# Patient Record
Sex: Female | Born: 2000 | Race: Black or African American | Hispanic: No | Marital: Single | Smoking: Never smoker
Health system: Southern US, Community
[De-identification: ages and names within clinical notes are randomized; demographics above are authoritative.]

## PROBLEM LIST (undated history)

## (undated) DIAGNOSIS — F909 Attention-deficit hyperactivity disorder, unspecified type: Secondary | ICD-10-CM

---
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
## 2019-07-01 ENCOUNTER — Encounter (HOSPITAL_COMMUNITY): Payer: Self-pay | Admitting: Emergency Medicine

## 2019-07-01 ENCOUNTER — Emergency Department (HOSPITAL_COMMUNITY): Payer: Medicaid Other

## 2019-07-01 ENCOUNTER — Other Ambulatory Visit: Payer: Self-pay

## 2019-07-01 ENCOUNTER — Emergency Department (HOSPITAL_COMMUNITY)
Admission: EM | Admit: 2019-07-01 | Discharge: 2019-07-01 | Disposition: A | Discharge status: Home / Self Care | Payer: Medicaid Other | Attending: Emergency Medicine | Admitting: Emergency Medicine

## 2019-07-01 DIAGNOSIS — S46912A Strain of unspecified muscle, fascia and tendon at shoulder and upper arm level, left arm, initial encounter: Secondary | ICD-10-CM | POA: Insufficient documentation

## 2019-07-01 DIAGNOSIS — S4992XA Unspecified injury of left shoulder and upper arm, initial encounter: Secondary | ICD-10-CM | POA: Diagnosis present

## 2019-07-01 DIAGNOSIS — Y9389 Activity, other specified: Secondary | ICD-10-CM | POA: Insufficient documentation

## 2019-07-01 DIAGNOSIS — Y929 Unspecified place or not applicable: Secondary | ICD-10-CM | POA: Diagnosis not present

## 2019-07-01 DIAGNOSIS — X509XXA Other and unspecified overexertion or strenuous movements or postures, initial encounter: Secondary | ICD-10-CM | POA: Insufficient documentation

## 2019-07-01 DIAGNOSIS — Y998 Other external cause status: Secondary | ICD-10-CM | POA: Insufficient documentation
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
## 2019-09-27 ENCOUNTER — Emergency Department (HOSPITAL_COMMUNITY)
Admission: EM | Admit: 2019-09-27 | Discharge: 2019-09-27 | Discharge status: Against Medical Advice | Payer: Medicaid Other

## 2020-05-20 ENCOUNTER — Other Ambulatory Visit: Payer: Self-pay

## 2020-05-20 ENCOUNTER — Ambulatory Visit (HOSPITAL_COMMUNITY)
Admission: EM | Admit: 2020-05-20 | Discharge: 2020-05-20 | Disposition: A | Discharge status: Other Acute Inpatient Hospital | Payer: Medicaid Other | Attending: Registered Nurse | Admitting: Registered Nurse

## 2020-05-20 ENCOUNTER — Encounter (HOSPITAL_COMMUNITY): Payer: Self-pay | Admitting: Registered Nurse

## 2020-05-20 ENCOUNTER — Ambulatory Visit (HOSPITAL_COMMUNITY)
Admission: RE | Admit: 2020-05-20 | Discharge: 2020-05-20 | Disposition: A | Discharge status: Home / Self Care | Payer: Medicaid Other | Source: Home / Self Care | Attending: Psychiatry | Admitting: Psychiatry

## 2020-05-20 ENCOUNTER — Inpatient Hospital Stay (HOSPITAL_COMMUNITY)
Admission: AD | Admit: 2020-05-20 | Discharge: 2020-05-23 | DRG: 885 | Disposition: A | Discharge status: Home / Self Care | Payer: Medicaid Other | Source: Intra-hospital | Attending: Psychiatry | Admitting: Psychiatry

## 2020-05-20 DIAGNOSIS — G4700 Insomnia, unspecified: Secondary | ICD-10-CM | POA: Diagnosis present

## 2020-05-20 DIAGNOSIS — Z20822 Contact with and (suspected) exposure to covid-19: Secondary | ICD-10-CM | POA: Diagnosis present

## 2020-05-20 DIAGNOSIS — R45851 Suicidal ideations: Secondary | ICD-10-CM | POA: Diagnosis present

## 2020-05-20 DIAGNOSIS — F332 Major depressive disorder, recurrent severe without psychotic features: Secondary | ICD-10-CM | POA: Insufficient documentation

## 2020-05-20 LAB — URINALYSIS, ROUTINE W REFLEX MICROSCOPIC
Bilirubin Urine: NEGATIVE
Glucose, UA: NEGATIVE mg/dL
Hgb urine dipstick: NEGATIVE
Ketones, ur: NEGATIVE mg/dL
Leukocytes,Ua: NEGATIVE
Nitrite: NEGATIVE
Protein, ur: NEGATIVE mg/dL
Specific Gravity, Urine: 1.013 (ref 1.005–1.030)
pH: 7 (ref 5.0–8.0)

## 2020-05-20 LAB — CBC WITH DIFFERENTIAL/PLATELET
Abs Immature Granulocytes: 0.01 10*3/uL (ref 0.00–0.07)
Basophils Absolute: 0 10*3/uL (ref 0.0–0.1)
Basophils Relative: 1 %
Eosinophils Absolute: 0.1 10*3/uL (ref 0.0–0.5)
Eosinophils Relative: 1 %
HCT: 42.1 % (ref 36.0–46.0)
Hemoglobin: 13.4 g/dL (ref 12.0–15.0)
Immature Granulocytes: 0 %
Lymphocytes Relative: 34 %
Lymphs Abs: 2 10*3/uL (ref 0.7–4.0)
MCH: 26.7 pg (ref 26.0–34.0)
MCHC: 31.8 g/dL (ref 30.0–36.0)
MCV: 83.9 fL (ref 80.0–100.0)
Monocytes Absolute: 0.4 10*3/uL (ref 0.1–1.0)
Monocytes Relative: 6 %
Neutro Abs: 3.4 10*3/uL (ref 1.7–7.7)
Neutrophils Relative %: 58 %
Platelets: 282 10*3/uL (ref 150–400)
RBC: 5.02 MIL/uL (ref 3.87–5.11)
RDW: 14.5 % (ref 11.5–15.5)
WBC: 5.8 10*3/uL (ref 4.0–10.5)
nRBC: 0 % (ref 0.0–0.2)

## 2020-05-20 LAB — COMPREHENSIVE METABOLIC PANEL
ALT: 18 U/L (ref 0–44)
AST: 23 U/L (ref 15–41)
Albumin: 4.6 g/dL (ref 3.5–5.0)
Alkaline Phosphatase: 61 U/L (ref 38–126)
Anion gap: 9 (ref 5–15)
BUN: 11 mg/dL (ref 6–20)
CO2: 26 mmol/L (ref 22–32)
Calcium: 9.9 mg/dL (ref 8.9–10.3)
Chloride: 104 mmol/L (ref 98–111)
Creatinine, Ser: 0.97 mg/dL (ref 0.44–1.00)
GFR, Estimated: >60 mL/min (ref >60)
Glucose, Bld: 89 mg/dL (ref 70–99)
Potassium: 4.4 mmol/L (ref 3.5–5.1)
Sodium: 139 mmol/L (ref 135–145)
Total Bilirubin: 0.4 mg/dL (ref 0.3–1.2)
Total Protein: 8.1 g/dL (ref 6.5–8.1)

## 2020-05-20 LAB — POCT URINE DRUG SCREEN - MANUAL ENTRY (I-SCREEN)
POC Amphetamine UR: NOT DETECTED
POC Buprenorphine (BUP): NOT DETECTED
POC Cocaine UR: NOT DETECTED
POC Marijuana UR: NOT DETECTED
POC Methadone UR: NOT DETECTED
POC Methamphetamine UR: NOT DETECTED
POC Morphine: NOT DETECTED
POC Oxazepam (BZO): NOT DETECTED
POC Oxycodone UR: NOT DETECTED
POC Secobarbital (BAR): NOT DETECTED

## 2020-05-20 LAB — TSH: TSH: 0.865 u[IU]/mL (ref 0.350–4.500)

## 2020-05-20 LAB — POC SARS CORONAVIRUS 2 AG -  ED: SARS Coronavirus 2 Ag: NEGATIVE

## 2020-05-20 LAB — LIPID PANEL
Cholesterol: 173 mg/dL (ref 0–200)
HDL: 57 mg/dL (ref >40)
LDL Cholesterol: 109 mg/dL — ABNORMAL HIGH (ref 0–99)
Total CHOL/HDL Ratio: 3 RATIO
Triglycerides: 35 mg/dL (ref <150)
VLDL: 7 mg/dL (ref 0–40)

## 2020-05-20 LAB — RESP PANEL BY RT-PCR (FLU A&B, COVID) ARPGX2
Influenza A by PCR: NEGATIVE
Influenza B by PCR: NEGATIVE
SARS Coronavirus 2 by RT PCR: NEGATIVE

## 2020-05-20 LAB — HEMOGLOBIN A1C
Hgb A1c MFr Bld: 5.3 % (ref 4.8–5.6)
Mean Plasma Glucose: 105.41 mg/dL

## 2020-05-20 LAB — PREGNANCY, URINE: Preg Test, Ur: NEGATIVE

## 2020-05-20 LAB — POC SARS CORONAVIRUS 2 AG: SARS Coronavirus 2 Ag: NEGATIVE

## 2020-05-20 LAB — MAGNESIUM: Magnesium: 2.3 mg/dL (ref 1.7–2.4)

## 2020-05-20 LAB — ETHANOL: Alcohol, Ethyl (B): <10 mg/dL (ref <10)

## 2020-05-20 MED ORDER — ACETAMINOPHEN 325 MG PO TABS: 650.0000 mg | ORAL_TABLET | Freq: Four times a day (QID) | ORAL | Status: DC | PRN

## 2020-05-20 NOTE — Progress Notes (Signed)
Pt is admitted to Flex bed 2 due to SI with no plan. Pt is alert and oriented with flat affect. Pt is ambulatory and is oriented to staff and unit. Cooperative with skin assessment. Nourishment offered. Pt denies pain, SI, HI and AVH at this time. Staff will monitor for pt's safety.

## 2020-05-20 NOTE — ED Notes (Signed)
Pt alert and orient x 4. Pt calm and cooperative. No c/o of pain or distress. Will continue to monitor for safety

## 2020-05-20 NOTE — ED Notes (Signed)
Safe transport called 

## 2020-05-20 NOTE — BH Assessment (Addendum)
Upon chart review: TTS LCSW, "Gave clinical report to Vernard Gambles , NP who determined Pt meets criteria for inpatient psychiatric treatment. Rona Ravens  [facility] at [facility] confirmed an appropriate bed is not currently available. Patient will transfer to [facility] per Reola Calkins, NP  Other facilities will be contacted for placement. Notified Dr. Ovidio Kin  , MD and Reola Calkins, NP of disposition recommendation and the sitter utilization recommendation."   Followed up with [facility] Lorin Glass, RN) @1754  to inquire about bed availability for this patient via secure chat.  Requested [facility] to review for a Mood Disorder bed.
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
## 2020-05-20 NOTE — BH Assessment (Signed)
Disposition: Patient accepted to [facility] adult unit, pending a negative COVID PCR. The accepting provider is Vernard Gambles. The attending provider is Dr. Jola Babinski. Patient assigned to Room #303-1. Nurse to nurse report [phone]. [facility] nursing to arrange transport to [facility].  Ryta, RN and Tobey Bride, RN, provided disposition updates.

## 2020-05-20 NOTE — ED Notes (Signed)
On the phone 
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
## 2020-05-20 NOTE — ED Notes (Signed)
Report called to [place] RN@[facility]  adult
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
## 2020-05-21 ENCOUNTER — Encounter (HOSPITAL_COMMUNITY): Payer: Self-pay | Admitting: Psychiatry

## 2020-05-21 DIAGNOSIS — F332 Major depressive disorder, recurrent severe without psychotic features: Secondary | ICD-10-CM | POA: Insufficient documentation

## 2020-05-21 LAB — POCT PREGNANCY, URINE: Preg Test, Ur: NEGATIVE

## 2020-05-21 MED ORDER — TRAZODONE HCL 50 MG PO TABS: 50.0000 mg | ORAL_TABLET | Freq: Every evening | ORAL | Status: DC | PRN

## 2020-05-21 MED ORDER — MAGNESIUM HYDROXIDE 400 MG/5ML PO SUSP: 30.0000 mL | Freq: Every day | ORAL | Status: DC | PRN

## 2020-05-21 MED ORDER — HYDROXYZINE HCL 25 MG PO TABS: 25.0000 mg | ORAL_TABLET | Freq: Three times a day (TID) | ORAL | Status: DC | PRN

## 2020-05-21 MED ORDER — ALUM & MAG HYDROXIDE-SIMETH 200-200-20 MG/5ML PO SUSP: 30.0000 mL | ORAL | Status: DC | PRN

## 2020-05-21 MED ORDER — ACETAMINOPHEN 325 MG PO TABS: 650.0000 mg | ORAL_TABLET | Freq: Four times a day (QID) | ORAL | Status: DC | PRN

## 2020-05-21 MED ORDER — SERTRALINE HCL 50 MG PO TABS
50.0000 mg | ORAL_TABLET | Freq: Every day | ORAL | Status: DC
  Administered 2020-05-21 – 2020-05-23 (×3): 50 mg via ORAL
  Filled 2020-05-21 (×5): qty 1

## 2020-05-21 NOTE — Progress Notes (Cosign Needed)
Adult Psychoeducational Group Note  Date:  05/21/2020 Time:  10:26 AM  Group Topic/Focus:  Goals Group:   The focus of this group is to help patients establish daily goals to achieve during treatment and discuss how the patient can incorporate goal setting into their daily lives to aide in recovery.  Participation Level:  Minimal  Participation Quality:  Appropriate  Affect:  Appropriate  Cognitive:  Lacking  Insight: Lacking  Engagement in Group:  Limited  Modes of Intervention:  Discussion  Additional Comments:  Pt attended group but did minimal participation.   Kyndall Chaplin R Artha Chiasson 05/21/2020, 10:26 AM

## 2020-05-21 NOTE — Plan of Care (Signed)
Nurse discussed coping skills with patient.  
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
## 2020-05-21 NOTE — Tx Team (Signed)
Initial Treatment Plan 05/21/2020 3:15 AM [place] Poli DBZ:[id number]    PATIENT STRESSORS: Educational concerns Marital or family conflict   PATIENT STRENGTHS: Ability for insight Average or above average intelligence Capable of independent living General fund of knowledge Motivation for treatment/growth Supportive family/friends   PATIENT IDENTIFIED PROBLEMS: Suicidal ideation   depression  (overall mood and coping mechanisms)                 DISCHARGE CRITERIA:  Improved stabilization in mood, thinking, and/or behavior Motivation to continue treatment in a less acute level of care Verbal commitment to aftercare and medication compliance  PRELIMINARY DISCHARGE PLAN: Outpatient therapy Return to previous living arrangement Return to previous work or school arrangements  PATIENT/FAMILY INVOLVEMENT: This treatment plan has been presented to and reviewed with the patient, Sarah Alexander, and/or family member.  The patient and family have been given the opportunity to ask questions and make suggestions.  Mancel Bale, RN 05/21/2020, 3:15 AM

## 2020-05-21 NOTE — Progress Notes (Signed)
   05/21/20 2030  Psych Admission Type (Psych Patients Only)  Admission Status Voluntary  Psychosocial Assessment  Patient Complaints Anxiety;Depression  Eye Contact Fair  Facial Expression Anxious  Affect Flat;Depressed;Sad  Speech Logical/coherent  Interaction Assertive  Motor Activity Other (Comment) (wnl)  Appearance/Hygiene Unremarkable  Behavior Characteristics Cooperative;Anxious  Mood Depressed;Anxious  Thought Process  Coherency WDL  Content Blaming self  Delusions None reported or observed  Perception WDL  Hallucination None reported or observed  Judgment Poor  Confusion None  Danger to Self  Current suicidal ideation? Denies  Danger to Others  Danger to Others None reported or observed   Pt seen in dayroom. Pt denies SI, HI, AVH and pain. Pt rates anxiety 2-3/10 and depression 2-3/10. This is patient's first inpatient admission. "I was a bit anxious this morning. I came in last night and went right to sleep. This morning I was thinking, 'I have become one of those people.'" Asked pt if she still felt that way and she said, "I realized that there are lots of different people here. I stopped judging." Pt says she was open to medication even though she previously thought it was not for her. "What I was doing wasn't working so I'm thinking about medication." Pt anxious about getting in touch with her advisor about scheduling her classes. "I'm supposed to take classes in summer school and the fall and I need to make sure I get in them but it's hard to manage while I'm in here."

## 2020-05-21 NOTE — Progress Notes (Signed)
D:  Patient's self inventory sheet, patient has poor sleep, sleep medication given.  Fair appetite, low energy level, poor concentration.  Rated depresson 8, hopeless 9.anxiety 6.  Denied withdrawals.  Denied SI.  Denied physical pain. Goal is get ADHD medication, stop crying, and being depressed daily. No discharge plan. A.  Medications administered per MD offers.  Safety maintained with 15 minute checks. R:  Safety maintained with 15 minute checks.  Denied SI and HI, contracts for safety.  Denied A/V hallucinations.

## 2020-05-21 NOTE — Progress Notes (Signed)
Admission Note: 05/20/2020 Sarah Alexander MRN: [id number] Patient is 20 year old female who presented to [facility] from [facility] for suicidal ideation with no plan and worsening depression. Patient reports a break up with her boyfriend, social issues with friends, and educational issues are her main stressors. Patient reports feeling depressed over the past year but reports it has worsened within the past month. Patient presents with flat and sad affect at time of assessment. Patient is positive for passive SI and verbally contracts for safety. Patient denies HI/AVH at this time.
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
## 2020-05-22 NOTE — Progress Notes (Signed)
RN reviewed Georga Kaufmann (Runner, broadcasting/film/video) documentation of pt's flowsheets for today and assessment and this RN is in agreement with documentation and pt's plan of care.

## 2020-05-22 NOTE — Progress Notes (Cosign Needed)
Adult Psychoeducational Group Note  Date:  05/22/2020 Time:  11:59 AM  Group Topic/Focus:  Goals Group:   The focus of this group is to help patients establish daily goals to achieve during treatment and discuss how the patient can incorporate goal setting into their daily lives to aide in recovery.  Participation Level:  Active  Participation Quality:  Appropriate  Affect:  Appropriate  Cognitive:  Alert  Insight: Appropriate  Engagement in Group:  Engaged  Modes of Intervention:  Discussion  Additional Comments:  Pt attended group and participated in discussion.  Sarah Alexander 05/22/2020, 11:59 AM

## 2020-05-22 NOTE — Progress Notes (Signed)
[facility] MD Progress Note  05/22/2020 3:05 PM Sarah Alexander  MRN:  [id number]   Subjective:  "I am feeling way better today."  Objective: Medical record reviewed.  The patient's case was discussed in detail with members of the treatment team.  I met with and interviewed the patient today in the office on the unit.  The patient reports that she feels much less depressed and less anxious today than she did yesterday.  "I am feeling way better today."  Patient reports feeling less down on herself about having been admitted to the hospital.  She has found conversations with peers to be helpful and supportive.  The patient reports experiencing less academic stress since she was able to make a call with the assistance of the social worker to her school this morning.  She states that her depression is now a 1 out of 10 in intensity.  She is much less anxious.  The patient denies passive wish for death and denies any suicidal ideation, intent or plan.  She denies thoughts of engaging in nonsuicidal self-injurious behavior, AI, HI, PI, AH or VH.  The patient reports that appetite is good.  She is sleeping adequately.  The patient denies any side effects from her medications.  She denies any physical problems.  During my interview with patient her eye contact is much improved.  There is good relatedness.  She is engaged in our conversation.  There are no motor abnormalities.  Speech is spontaneous and of normal rate and amount.  Mood is better.  Affect appears euthymic with appropriate range and reactivity.  Thought processes are coherent, linear and goal-directed.  There is no evidence of formal thought disorder.  Thought content contains no paranoid ideation, no referential thinking and no delusional content.  There is less ruminative worry.  The patient denies any perceptual abnormalities and does not appear to respond to internal stimuli.  She is alert and oriented.  Attention and concentration are grossly intact.   Insight and judgment are intact.  I discussed with patient that the team is working on setting up outpatient psychotherapy and psychiatric medication management appointments for her.  Patient expressed that she is interested in participating in outpatient treatment.  She now feels less overwhelmed by academic, relationship and financial stressors.    Per staff report and chart review, the patient has been interacting appropriately with staff and peers.  She has been attending groups.  The patient has not engaged in any self-injurious behavior or any aggressive behavior while on the unit.  She is taking standing dose medications as prescribed.  Patient did not take any as needed medications yesterday.  Vital signs this morning included blood pressure of 123/100 seated and 122/99 standing.  Heart rate was 92 seated and 117 standing.    Principal Problem: MDD (major depressive disorder), recurrent severe, without psychosis ([place]) Diagnosis: Principal Problem:   MDD (major depressive disorder), recurrent severe, without psychosis ([place])  Total Time spent with patient: 20 minutes  Past Psychiatric History: See H&P  Past Medical History:  Past Medical History:  Diagnosis Date  . ADHD    History reviewed. No pertinent surgical history. Family History: History reviewed. No pertinent family history. Family Psychiatric  History: See H&P Social History:  Social History   Substance and Sexual Activity  Alcohol Use None     Social History   Substance and Sexual Activity  Drug Use Not on file    Social History   Socioeconomic History  .  Marital status: Single    Spouse name: Not on file  . Number of children: Not on file  . Years of education: Not on file  . Highest education level: Not on file  Occupational History  . Not on file  Tobacco Use  . Smoking status: Never Smoker  . Smokeless tobacco: Never Used  Vaping Use  . Vaping Use: Never used  Substance and Sexual Activity  . Alcohol  use: Not on file  . Drug use: Not on file  . Sexual activity: Not Currently  Other Topics Concern  . Not on file  Social History Narrative  . Not on file   Social Determinants of Health   Financial Resource Strain: Not on file  Food Insecurity: Not on file  Transportation Needs: Not on file  Physical Activity: Not on file  Stress: Not on file  Social Connections: Not on file   Additional Social History:                         Sleep: Good  Appetite:  Good  Current Medications: Current Facility-Administered Medications  Medication Dose Route Frequency Provider Last Rate Last Admin  . acetaminophen (TYLENOL) tablet 650 mg  650 mg Oral Q6H PRN Nwoko, Uchenna E, PA      . alum & mag hydroxide-simeth (MAALOX/MYLANTA) 200-200-20 MG/5ML suspension 30 mL  30 mL Oral Q4H PRN Nwoko, Uchenna E, PA      . hydrOXYzine (ATARAX/VISTARIL) tablet 25 mg  25 mg Oral TID PRN Nwoko, Uchenna E, PA      . magnesium hydroxide (MILK OF MAGNESIA) suspension 30 mL  30 mL Oral Daily PRN Nwoko, Uchenna E, PA      . sertraline (ZOLOFT) tablet 50 mg  50 mg Oral Daily Arthor Captain, MD   50 mg at 05/22/20 3016  . traZODone (DESYREL) tablet 50 mg  50 mg Oral QHS PRN Nwoko, Uchenna E, PA        Lab Results:  Results for orders placed or performed during the hospital encounter of 05/20/20 (from the past 48 hour(s))  Resp Panel by RT-PCR (Flu A&B, Covid) Nasopharyngeal Swab     Status: None   Collection Time: 05/20/20  5:07 PM   Specimen: Nasopharyngeal Swab; Nasopharyngeal(NP) swabs in vial transport medium  Result Value Ref Range   SARS Coronavirus 2 by RT PCR NEGATIVE NEGATIVE    Comment: (NOTE) SARS-CoV-2 target nucleic acids are NOT DETECTED.  The SARS-CoV-2 RNA is generally detectable in upper respiratory specimens during the acute phase of infection. The lowest concentration of SARS-CoV-2 viral copies this assay can detect is 138 copies/mL. A negative result does not preclude  SARS-Cov-2 infection and should not be used as the sole basis for treatment or other patient management decisions. A negative result may occur with  improper specimen collection/handling, submission of specimen other than nasopharyngeal swab, presence of viral mutation(s) within the areas targeted by this assay, and inadequate number of viral copies(<138 copies/mL). A negative result must be combined with clinical observations, patient history, and epidemiological information. The expected result is Negative.  Fact Sheet for Patients:  EntrepreneurPulse.com.au  Fact Sheet for Healthcare Providers:  IncredibleEmployment.be  This test is no t yet approved or cleared by the [place] FDA and  has been authorized for detection and/or diagnosis of SARS-CoV-2 by FDA under an Emergency Use Authorization (EUA). This EUA will remain  in effect (meaning this test can be used) for the  duration of the COVID-19 declaration under Section 564(b)(1) of the Act, 21 U.S.C.section 360bbb-3(b)(1), unless the authorization is terminated  or revoked sooner.       Influenza A by PCR NEGATIVE NEGATIVE   Influenza B by PCR NEGATIVE NEGATIVE    Comment: (NOTE) The Xpert Xpress SARS-CoV-2/FLU/RSV plus assay is intended as an aid in the diagnosis of influenza from Nasopharyngeal swab specimens and should not be used as a sole basis for treatment. Nasal washings and aspirates are unacceptable for Xpert Xpress SARS-CoV-2/FLU/RSV testing.  Fact Sheet for Patients: EntrepreneurPulse.com.au  Fact Sheet for Healthcare Providers: IncredibleEmployment.be  This test is not yet approved or cleared by the [place] FDA and has been authorized for detection and/or diagnosis of SARS-CoV-2 by FDA under an Emergency Use Authorization (EUA). This EUA will remain in effect (meaning this test can be used) for the duration of the COVID-19  declaration under Section 564(b)(1) of the Act, 21 U.S.C. section 360bbb-3(b)(1), unless the authorization is terminated or revoked.  Performed at [facility], [place] [address] [place], [place] [address]   POC SARS Coronavirus 2 Ag-ED - Nasal Swab (BD Veritor Kit)     Status: None   Collection Time: 05/20/20  5:07 PM  Result Value Ref Range   SARS Coronavirus 2 Ag Negative Negative  CBC with Differential/Platelet     Status: None   Collection Time: 05/20/20  5:14 PM  Result Value Ref Range   WBC 5.8 4.0 - 10.5 K/uL   RBC 5.02 3.87 - 5.11 MIL/uL   Hemoglobin 13.4 12.0 - 15.0 g/dL   HCT 42.1 36.0 - 46.0 %   MCV 83.9 80.0 - 100.0 fL   MCH 26.7 26.0 - 34.0 pg   MCHC 31.8 30.0 - 36.0 g/dL   RDW 14.5 11.5 - 15.5 %   Platelets 282 150 - 400 K/uL   nRBC 0.0 0.0 - 0.2 %   Neutrophils Relative % 58 %   Neutro Abs 3.4 1.7 - 7.7 K/uL   Lymphocytes Relative 34 %   Lymphs Abs 2.0 0.7 - 4.0 K/uL   Monocytes Relative 6 %   Monocytes Absolute 0.4 0.1 - 1.0 K/uL   Eosinophils Relative 1 %   Eosinophils Absolute 0.1 0.0 - 0.5 K/uL   Basophils Relative 1 %   Basophils Absolute 0.0 0.0 - 0.1 K/uL   Immature Granulocytes 0 %   Abs Immature Granulocytes 0.01 0.00 - 0.07 K/uL    Comment: Performed at [facility], [address], [place], [place] [address]  Comprehensive metabolic panel     Status: None   Collection Time: 05/20/20  5:14 PM  Result Value Ref Range   Sodium 139 135 - 145 mmol/L   Potassium 4.4 3.5 - 5.1 mmol/L   Chloride 104 98 - 111 mmol/L   CO2 26 22 - 32 mmol/L   Glucose, Bld 89 70 - 99 mg/dL    Comment: Glucose reference range applies only to samples taken after fasting for at least 8 hours.   BUN 11 6 - 20 mg/dL   Creatinine, Ser 0.97 0.44 - 1.00 mg/dL   Calcium 9.9 8.9 - 10.3 mg/dL   Total Protein 8.1 6.5 - 8.1 g/dL   Albumin 4.6 3.5 - 5.0 g/dL   AST 23 15 - 41 U/L   ALT 18 0 - 44 U/L   Alkaline Phosphatase 61 38 - 126 U/L   Total Bilirubin 0.4 0.3 - 1.2  mg/dL  GFR, Estimated >60 >60 mL/min    Comment: (NOTE) Calculated using the CKD-EPI Creatinine Equation (2021)    Anion gap 9 5 - 15    Comment: Performed at [facility], [address] [place], [place] [address]  Hemoglobin A1c     Status: None   Collection Time: 05/20/20  5:14 PM  Result Value Ref Range   Hgb A1c MFr Bld 5.3 4.8 - 5.6 %    Comment: (NOTE) Pre diabetes:          5.7%-6.4%  Diabetes:              >6.4%  Glycemic control for   <7.0% adults with diabetes    Mean Plasma Glucose 105.41 mg/dL    Comment: Performed at [facility], [address] [place], [place] [address]  Magnesium     Status: None   Collection Time: 05/20/20  5:14 PM  Result Value Ref Range   Magnesium 2.3 1.7 - 2.4 mg/dL    Comment: Performed at [facility], [address], [place], [place] [address]  Ethanol     Status: None   Collection Time: 05/20/20  5:14 PM  Result Value Ref Range   Alcohol, Ethyl (B) <10 <10 mg/dL    Comment: (NOTE) Lowest detectable limit for serum alcohol is 10 mg/dL.  For medical purposes only. Performed at [facility], [address] [place], [place] [address]   Lipid panel     Status: Abnormal   Collection Time: 05/20/20  5:14 PM  Result Value Ref Range   Cholesterol 173 0 - 200 mg/dL   Triglycerides 35 <484 mg/dL   HDL 57 >72 mg/dL   Total CHOL/HDL Ratio 3.0 RATIO   VLDL 7 0 - 40 mg/dL   LDL Cholesterol 072 (H) 0 - 99 mg/dL    Comment:        Total Cholesterol/HDL:CHD Risk Coronary Heart Disease Risk Table                     Men   Women  1/2 Average Risk   3.4   3.3  Average Risk       5.0   4.4  2 X Average Risk   9.6   7.1  3 X Average Risk  23.4   11.0        Use the calculated Patient Ratio above and the CHD Risk Table to determine the patient's CHD Risk.        ATP III CLASSIFICATION (LDL):  <100     mg/dL   Optimal  182-883  mg/dL   Near or Above                    Optimal  130-159  mg/dL   Borderline   374-451  mg/dL   High  >460     mg/dL   Very High Performed at [facility], [address], [place], [place] [address]   TSH     Status: None   Collection Time: 05/20/20  5:14 PM  Result Value Ref Range   TSH 0.865 0.350 - 4.500 uIU/mL    Comment: Performed by a 3rd Generation assay with a functional sensitivity of <=0.01 uIU/mL. Performed at [facility], [address] [place], [place] [address]   Urinalysis, Routine w reflex microscopic Urine, Clean Catch     Status: None   Collection Time: 05/20/20  5:18 PM  Result Value Ref Range   Color, Urine YELLOW YELLOW   APPearance CLEAR CLEAR   Specific Gravity, Urine 1.013 1.005 - 1.030   pH 7.0 5.0 - 8.0   Glucose, UA NEGATIVE NEGATIVE mg/dL   Hgb urine dipstick NEGATIVE NEGATIVE   Bilirubin Urine NEGATIVE NEGATIVE   Ketones, ur NEGATIVE NEGATIVE mg/dL   Protein, ur NEGATIVE NEGATIVE mg/dL   Nitrite NEGATIVE NEGATIVE   Leukocytes,Ua NEGATIVE NEGATIVE    Comment: Performed at [facility] [address] [place], [place] [address]  Pregnancy, urine     Status: None   Collection Time: 05/20/20  5:18 PM  Result Value Ref Range   Preg Test, Ur NEGATIVE NEGATIVE    Comment: Performed at [facility] [address] [place],  [address]  POCT Urine Drug Screen - ([facility])     Status: Normal   Collection Time: 05/20/20  5:20 PM  Result Value Ref Range   POC Amphetamine UR None Detected NONE DETECTED (Cut Off Level 1000 ng/mL)   POC Secobarbital (BAR) None Detected NONE DETECTED (Cut Off Level 300 ng/mL)   POC Buprenorphine (BUP) None Detected NONE DETECTED (Cut Off Level 10 ng/mL)   POC Oxazepam (BZO) None Detected NONE DETECTED (Cut Off Level 300 ng/mL)   POC Cocaine UR None Detected NONE DETECTED (Cut Off Level 300 ng/mL)   POC Methamphetamine UR None Detected NONE DETECTED (Cut Off Level 1000 ng/mL)   POC Morphine None Detected NONE DETECTED (Cut Off Level 300 ng/mL)   POC Oxycodone UR None Detected  NONE DETECTED (Cut Off Level 100 ng/mL)   POC Methadone UR None Detected NONE DETECTED (Cut Off Level 300 ng/mL)   POC Marijuana UR None Detected NONE DETECTED (Cut Off Level 50 ng/mL)  POC SARS Coronavirus 2 Ag     Status: None   Collection Time: 05/20/20  5:29 PM  Result Value Ref Range   SARS Coronavirus 2 Ag NEGATIVE NEGATIVE    Comment: (NOTE) SARS-CoV-2 antigen NOT DETECTED.   Negative results are presumptive.  Negative results do not preclude SARS-CoV-2 infection and should not be used as the sole basis for treatment or other patient management decisions, including infection  control decisions, particularly in the presence of clinical signs and  symptoms consistent with COVID-19, or in those who have been in contact with the virus.  Negative results must be combined with clinical observations, patient history, and epidemiological information. The expected result is Negative.  Fact Sheet for Patients: HandmadeRecipes.com.cy  Fact Sheet for Healthcare Providers: FuneralLife.at  This test is not yet approved or cleared by the [place] FDA and  has been authorized for detection and/or diagnosis of SARS-CoV-2 by FDA under an Emergency Use Authorization (EUA).  This EUA will remain in effect (meaning this test can be used) for the duration of  the COV ID-19 declaration under Section 564(b)(1) of the Act, 21 U.S.C. section 360bbb-3(b)(1), unless the authorization is terminated or revoked sooner.    Pregnancy, urine POC     Status: None   Collection Time: 05/20/20  5:29 PM  Result Value Ref Range   Preg Test, Ur NEGATIVE NEGATIVE    Comment:        THE SENSITIVITY OF THIS METHODOLOGY IS >24 mIU/mL     Blood Alcohol level:  Lab Results  Component Value Date   ETH <10 36/62/9476    Metabolic Disorder Labs: Lab Results  Component Value Date   HGBA1C 5.3 05/20/2020  MPG 105.41 05/20/2020   No results found for:  PROLACTIN Lab Results  Component Value Date   CHOL 173 05/20/2020   TRIG 35 05/20/2020   HDL 57 05/20/2020   CHOLHDL 3.0 05/20/2020   VLDL 7 05/20/2020   LDLCALC 109 (H) 05/20/2020    Physical Findings: AIMS:  , ,  ,  ,    CIWA:    COWS:     Musculoskeletal: Strength & Muscle Tone: within normal limits Gait & Station: normal Patient leans: N/A  Psychiatric Specialty Exam:  Presentation  General Appearance: Appropriate for Environment; Casual; Well Groomed  Eye Contact:Good  Speech:Clear and Coherent; Normal Rate  Speech Volume:Normal  Handedness:Right   Mood and Affect  Mood:Depressed ("Much less depressed")  Affect:Full Range; Appropriate   Thought Process  Thought Processes:Coherent; Goal Directed; Linear  Descriptions of Associations:Intact  Orientation:Full (Time, Place and Person)  Thought Content:Logical  History of Schizophrenia/Schizoaffective disorder:No  Duration of Psychotic Symptoms:No data recorded Hallucinations:Hallucinations: None  Ideas of Reference:None  Suicidal Thoughts:Suicidal Thoughts: No  Homicidal Thoughts:Homicidal Thoughts: No   Sensorium  Memory:Immediate Good; Recent Good; Remote Good  Judgment:Good  Insight:Good   Executive Functions  Concentration:Good  Attention Span:Good  [place] of Knowledge:Good  Language:Good   Psychomotor Activity  Psychomotor Activity:Psychomotor Activity: Normal   Assets  Assets:Communication Skills; Desire for Improvement; Physical Health; Housing; Resilience; Social Support; Vocational/Educational   Sleep  Sleep:Sleep: Fair    Physical Exam: Physical Exam Vitals and nursing note reviewed.  Constitutional:      Appearance: Normal appearance.  HENT:     Head: Normocephalic and atraumatic.  Pulmonary:     Effort: Pulmonary effort is normal.  Neurological:     General: No focal deficit present.     Mental Status: She is alert and oriented to person,  place, and time.    Review of Systems  Constitutional: Negative for chills, diaphoresis and fever.  HENT: Negative for sore throat.   Eyes: Negative for blurred vision.  Respiratory: Negative for cough and shortness of breath.   Cardiovascular: Negative for chest pain and palpitations.  Gastrointestinal: Negative for constipation, diarrhea, nausea and vomiting.  Genitourinary: Negative for dysuria.  Musculoskeletal: Negative for joint pain and myalgias.  Skin: Negative for rash.  Neurological: Negative for dizziness, tremors and headaches.  Endo/Heme/Allergies: Does not bruise/bleed easily.  Psychiatric/Behavioral: Positive for depression. Negative for hallucinations and suicidal ideas. The patient is nervous/anxious.    Blood pressure (!) 122/99, pulse (!) 117, temperature 98.5 F (36.9 C), temperature source Oral, resp. rate 18, height $RemoveBe'5\' 3"'[id number]$  (1.6 m), weight 59 kg, SpO2 98 %. Body mass index is 23.03 kg/m.   Treatment Plan Summary: 20 year old female with prior history of ADD and untreated depression and anxiety admitted with worsening symptoms of major depressive episode including suicidal ideation in the context of multiple psychosocial stressors.    The patient is tolerating treatment with Zoloft and her mood and anxiety symptoms are improving.  Inpatient psychiatric hospitalization is indicated for continued treatment and stabilization of symptoms of depression and anxiety and for finalization of aftercare plans.  Daily contact with patient to assess and evaluate symptoms and progress in treatment and Medication management   Continue every 15-minute observation status for safety of patient.  Encouraged participation in group therapy and therapeutic milieu.  Depression/Anxiety -Continue Zoloft 50 mg daily for mood and anxiety symptoms -Continue hydroxyzine 25 mg 3 times daily as needed for anxiety  Insomnia -Continue trazodone 50 mg nightly as needed for  insomnia  Social  work is attempting to contact collaterals to [employer] and is working on aftercare plans.  Patient will need psychotherapist and outpatient psychiatrist as she had no outpatient psychiatry or therapy prior to admission.  I certify that inpatient services furnished can reasonably be expected to improve the patient's condition.  Arthor Captain, MD 05/22/2020, 3:05 PM

## 2020-05-22 NOTE — Progress Notes (Signed)
   05/22/20 1600  Psych Admission Type (Psych Patients Only)  Admission Status Voluntary  Psychosocial Assessment  Patient Complaints Anxiety;Depression  Eye Contact Fair  Facial Expression Anxious  Affect Appropriate to circumstance  Speech Logical/coherent  Interaction Assertive  Motor Activity Other (Comment) (WNL)  Appearance/Hygiene Unremarkable  Behavior Characteristics Anxious;Cooperative  Mood Anxious;Pleasant  Thought Process  Coherency WDL  Content Blaming self  Delusions None reported or observed  Perception WDL  Hallucination None reported or observed  Judgment Poor  Confusion None  Danger to Self  Current suicidal ideation? Denies  Self-Injurious Behavior No self-injurious ideation or behavior indicators observed or expressed   Agreement Not to Harm Self Yes  Description of Agreement Verbal Contract  Danger to Others  Danger to Others None reported or observed

## 2020-05-22 NOTE — Progress Notes (Signed)
Recreation Therapy Notes  Date: 4.6.22 Time: 0930 Location: [address] Dayroom  Group Topic: Stress Management   Goal Area(s) Addresses:  Patient will actively participate in stress management techniques presented during session.  Patient will successfully identify benefit of practicing stress management post d/c.   Behavioral Response: Engaged  Intervention: Guided exercise with ambient sound and script  Activity :Guided Imagery  LRT read a script that lead patients to envision floating on a cloud and going to any place they desire.  Patients were to listen to follow along as script was read to fully participate in activity.  Education:  Stress Management, Discharge Planning.   Education Outcome: Acknowledges education  Clinical Observations/Feedback: Pt actively engaged in the activity.  Pt was appropriate and expressed she floated on her cloud to heaven.  Pt emphasized it wasn't in a bad way.    Caroll Rancher, LRT/CTRS    Lillia Abed, Yaxiel Minnie A 05/22/2020 10:45 AM

## 2020-05-22 NOTE — Progress Notes (Signed)
   05/22/20 2157  Psych Admission Type (Psych Patients Only)  Admission Status Voluntary  Psychosocial Assessment  Patient Complaints None  Eye Contact Fair  Facial Expression Other (Comment) (appropriate)  Affect Appropriate to circumstance  Speech Logical/coherent  Interaction Assertive  Motor Activity Other (Comment) (wnl)  Appearance/Hygiene Unremarkable  Behavior Characteristics Cooperative  Mood Pleasant  Thought Process  Coherency WDL  Content WDL  Delusions None reported or observed  Perception WDL  Hallucination None reported or observed  Judgment Poor  Confusion None  Danger to Self  Current suicidal ideation? Denies  Danger to Others  Danger to Others None reported or observed

## 2020-05-22 NOTE — [facility] Group Notes (Signed)
Type of Therapy/Topic: Group Therapy: Six Dimensions of Wellness  Participation Level: Active  Description of Group:  This group will address the concept of wellness and the six concepts of wellness: occupational, physical, social, intellectual, spiritual, and emotional. Patients will be encouraged to process areas in their lives that are out of balance and identify reasons for remaining unbalanced. Patients will be encouraged to explore ways to practice healthy habits daily to attain better physical and mental health outcomes.  Therapeutic Goals:  1. Identify aspects of wellness that they are doing well.  2. Identify aspects of wellness that they would like to improve upon.  3. Identify one action they can take to improve an aspect of wellness in their lives.  Summary of Patient Progress: Due to unforseen circumstances CSW provided the patient with a  packet of worksheets during group time.  The Pt accepted these worksheets and was offered an opportunity to discuss the worksheets with the CSW.  Dorethea Strubel, LCSWA Clinicial Social Worker [facility] 

## 2020-05-22 NOTE — Tx Team (Signed)
Interdisciplinary Treatment and Diagnostic Plan Update  05/22/2020 Time of Session: 9:05am  Jameyah Fennewald MRN: [id number]  Principal Diagnosis: MDD (major depressive disorder), recurrent severe, without psychosis (HCC)  Secondary Diagnoses: Principal Problem:   MDD (major depressive disorder), recurrent severe, without psychosis (HCC)   Current Medications:  Current Facility-Administered Medications  Medication Dose Route Frequency Provider Last Rate Last Admin  . acetaminophen (TYLENOL) tablet 650 mg  650 mg Oral Q6H PRN Nwoko, Uchenna E, PA      . alum & mag hydroxide-simeth (MAALOX/MYLANTA) 200-200-20 MG/5ML suspension 30 mL  30 mL Oral Q4H PRN Nwoko, Uchenna E, PA      . hydrOXYzine (ATARAX/VISTARIL) tablet 25 mg  25 mg Oral TID PRN Nwoko, Uchenna E, PA      . magnesium hydroxide (MILK OF MAGNESIA) suspension 30 mL  30 mL Oral Daily PRN Nwoko, Uchenna E, PA      . sertraline (ZOLOFT) tablet 50 mg  50 mg Oral Daily Claudie Revering, MD   50 mg at 05/22/20 0350  . traZODone (DESYREL) tablet 50 mg  50 mg Oral QHS PRN Nwoko, Uchenna E, PA       PTA Medications: Medications Prior to Admission  Medication Sig Dispense Refill Last Dose  . Melatonin 10 MG TABS Take 10 mg by mouth at bedtime as needed (sleep).   Past Week at Unknown time  . Multiple Vitamin (MULTIVITAMIN WITH MINERALS) TABS tablet Take 1 tablet by mouth daily.   Past Week at Unknown time  . medroxyPROGESTERone (DEPO-PROVERA) 150 MG/ML injection Inject 150 mg into the muscle every 3 (three) months.   More than a month at Unknown time    Patient Stressors: Educational concerns Marital or family conflict  Patient Strengths: Ability for insight Average or above average intelligence Capable of independent living [employer] of knowledge Motivation for treatment/growth Supportive family/friends  Treatment Modalities: Medication Management, Group therapy, Case management,  1 to 1 session with clinician, Psychoeducation,  Recreational therapy.   Physician Treatment Plan for Primary Diagnosis: MDD (major depressive disorder), recurrent severe, without psychosis (HCC) Long Term Goal(s): Improvement in symptoms so as ready for discharge   Short Term Goals: Ability to identify changes in lifestyle to reduce recurrence of condition will improve Ability to verbalize feelings will improve Ability to disclose and discuss suicidal ideas Ability to identify and develop effective coping behaviors will improve Compliance with prescribed medications will improve Ability to identify triggers associated with substance abuse/mental health issues will improve  Medication Management: Evaluate patient's response, side effects, and tolerance of medication regimen.  Therapeutic Interventions: 1 to 1 sessions, Unit Group sessions and Medication administration.  Evaluation of Outcomes: Progressing  Physician Treatment Plan for Secondary Diagnosis: Principal Problem:   MDD (major depressive disorder), recurrent severe, without psychosis (HCC)  Long Term Goal(s): Improvement in symptoms so as ready for discharge   Short Term Goals: Ability to identify changes in lifestyle to reduce recurrence of condition will improve Ability to verbalize feelings will improve Ability to disclose and discuss suicidal ideas Ability to identify and develop effective coping behaviors will improve Compliance with prescribed medications will improve Ability to identify triggers associated with substance abuse/mental health issues will improve     Medication Management: Evaluate patient's response, side effects, and tolerance of medication regimen.  Therapeutic Interventions: 1 to 1 sessions, Unit Group sessions and Medication administration.  Evaluation of Outcomes: Progressing   RN Treatment Plan for Primary Diagnosis: MDD (major depressive disorder), recurrent severe, without psychosis (HCC)  Long Term Goal(s): Knowledge of disease and  therapeutic regimen to maintain health will improve  Short Term Goals: Ability to remain free from injury will improve, Ability to participate in decision making will improve, Ability to verbalize feelings will improve, Ability to disclose and discuss suicidal ideas and Ability to identify and develop effective coping behaviors will improve  Medication Management: RN will administer medications as ordered by provider, will assess and evaluate patient's response and provide education to patient for prescribed medication. RN will report any adverse and/or side effects to prescribing provider.  Therapeutic Interventions: 1 on 1 counseling sessions, Psychoeducation, Medication administration, Evaluate responses to treatment, Monitor vital signs and CBGs as ordered, Perform/monitor CIWA, COWS, AIMS and Fall Risk screenings as ordered, Perform wound care treatments as ordered.  Evaluation of Outcomes: Progressing   LCSW Treatment Plan for Primary Diagnosis: MDD (major depressive disorder), recurrent severe, without psychosis (HCC) Long Term Goal(s): Safe transition to appropriate next level of care at discharge, Engage patient in therapeutic group addressing interpersonal concerns.  Short Term Goals: Engage patient in aftercare planning with referrals and resources, Increase social support, Increase emotional regulation, Facilitate acceptance of mental health diagnosis and concerns, Identify triggers associated with mental health/substance abuse issues and Increase skills for wellness and recovery  Therapeutic Interventions: Assess for all discharge needs, 1 to 1 time with Social worker, Explore available resources and support systems, Assess for adequacy in community support network, Educate family and significant other(s) on suicide prevention, Complete Psychosocial Assessment, Interpersonal group therapy.  Evaluation of Outcomes: Progressing   Progress in Treatment: Attending groups:  Yes. Participating in groups: Yes. Taking medication as prescribed: Yes. Toleration medication: Yes. Family/Significant other contact made: Yes, individual(s) contacted:  Mother  Patient understands diagnosis: Yes. Discussing patient identified problems/goals with staff: Yes. Medical problems stabilized or resolved: Yes. Denies suicidal/homicidal ideation: Yes. Issues/concerns per patient self-inventory: No.   New problem(s) identified: No, Describe:  None   New Short Term/Long Term Goal(s): medication stabilization, elimination of SI thoughts, development of comprehensive mental wellness plan.   Patient Goals:  "To get medications and outpatient services"  Discharge Plan or Barriers: Patient recently admitted. CSW will continue to follow and assess for appropriate referrals and possible discharge planning.   Reason for Continuation of Hospitalization: Depression Medication stabilization Suicidal ideation  Estimated Length of Stay: 3 to 5 days   Attendees: Patient: Sarah Alexander 05/22/2020   Physician: Landry Mellow, MD 05/22/2020   Nursing:  05/22/2020   RN Care Manager: 05/22/2020   Social Worker: Melba Coon, LCSWA 05/22/2020   Recreational Therapist:  05/22/2020   Other:  05/22/2020   Other:  05/22/2020   Other: 05/22/2020     Scribe for Treatment Team: Aram Beecham, LCSWA 05/22/2020 11:09 AM

## 2020-05-23 DIAGNOSIS — F332 Major depressive disorder, recurrent severe without psychotic features: Principal | ICD-10-CM

## 2020-05-23 MED ORDER — TRAZODONE HCL 50 MG PO TABS: 50.0000 mg | ORAL_TABLET | Freq: Every evening | ORAL | 0 refills | Status: AC | PRN

## 2020-05-23 MED ORDER — SERTRALINE HCL 50 MG PO TABS: 50.0000 mg | ORAL_TABLET | Freq: Every day | ORAL | 0 refills | Status: AC

## 2020-05-23 MED ORDER — HYDROXYZINE HCL 25 MG PO TABS: 25.0000 mg | ORAL_TABLET | Freq: Three times a day (TID) | ORAL | 0 refills | Status: AC | PRN

## 2020-05-23 NOTE — [facility] Suicide Risk Assessment (Signed)
[facility] INPATIENT:  Family/Significant Other Suicide Prevention Education  Suicide Prevention Education:  Education Completed; Lilia Argue (Mother)  [phone],  (name of family member/significant other) has been identified by the patient as the family member/significant other with whom the patient will be residing, and identified as the person(s) who will aid the patient in the event of a mental health crisis (suicidal ideations/suicide attempt).  With written consent from the patient, the family member/significant other has been provided the following suicide prevention education, prior to the and/or following the discharge of the patient.  The suicide prevention education provided includes the following:  Suicide risk factors  Suicide prevention and interventions  National Suicide Hotline telephone number  [facility] assessment telephone number  [facility] Emergency Assistance 911  [facility] and/or Residential Mobile Crisis Unit telephone number  Request made of family/significant other to:  Remove weapons (e.g., guns, rifles, knives), all items previously/currently identified as safety concern.    Remove drugs/medications (over-the-counter, prescriptions, illicit drugs), all items previously/currently identified as a safety concern.  The family member/significant other verbalizes understanding of the suicide prevention education information provided.  The family member/significant other agrees to remove the items of safety concern listed above.  When CSW asked Ms. Perry-Battle what brought the pt to the hospital she stated, "She has been having trouble in school such as tough professors and focusing and she has such high expectations for herself. She has always had a strong desire to be a part of friend groups, one fell apart, and the other one she had split because she said she was no longer romantic with one of the guys. When she gets upset she tries to  deal with her old stuff and new stuff and so it all compounds and I don't think she could handle it.". No safety concerns. No weapons in home.   Felizardo Hoffmann 05/23/2020, 9:14 AM
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
## 2020-05-23 NOTE — Plan of Care (Signed)
Nurse discussed anxiety, depression and coping skills with patient.  

## 2020-05-23 NOTE — Progress Notes (Signed)
Discharge Note:  Patient discharged home.  Suicide prevention information given and discussed with patient who stated she understood and had no questions.   Patient stated she received all her belongings.  Denied SI and HI.  Denied A/V hallucinations.  All required discharge information given.

## 2020-05-23 NOTE — Progress Notes (Signed)
D:  Patient's self inventory sheet, patient has fair sleep, sleep medication helpful.  Good appetite, normal energy level, good concentration.  Rated depression, hopeless and anxiety 1.  Denied withdrawals.  Denied SI.  Denied physical problems.  Denied physical pain.  Goal is getting out and get my life together.  Plans to call mom.  Does have discharge plans. A:  Medications administered per MD orders.  Emotional support and encouragement given patient. R:  Denied SI and HI, contracts for safety.  Denied A/V hallucinations.  Safety maintained with 15 minute checks.

## 2020-05-23 NOTE — Progress Notes (Signed)
  [facility] Adult Case Management Discharge Plan :  Will you be returning to the same living situation after discharge:  Yes,  apartment At discharge, do you have transportation home?: Yes,  car at [facility] Do you have the ability to pay for your medications: Yes,  medicaid  Release of information consent forms completed and in the chart;  Patient's signature needed at discharge.  Patient to Follow up at:  Follow-up Information    [facility], [facility]. Go on 05/29/2020.   Why: You have a hospital follow up appointment for therapy and medication management services on 05/29/20 at 8:30 am.  This appointment will be held in person and will be approximately 2 hours in length.  Contact information: [address] [place] [address] [phone]               Next level of care provider has access to [facility] Link:no  Safety Planning and Suicide Prevention discussed: Yes,  mother  Have you used any form of tobacco in the last 30 days? (Cigarettes, Smokeless Tobacco, Cigars, and/or Pipes): No  Has patient been referred to the Quitline?: N/A patient is not a smoker  Patient has been referred for addiction treatment: N/A  Felizardo Hoffmann, LCSWA 05/23/2020, 10:11 AM
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
